# Patient Record
Sex: Female | Born: 1955 | Race: Black or African American | Hispanic: No | Marital: Married | Smoking: Never smoker
Health system: Southern US, Community
[De-identification: ages and names within clinical notes are randomized; demographics above are authoritative.]

## PROBLEM LIST (undated history)

## (undated) DIAGNOSIS — E785 Hyperlipidemia, unspecified: Secondary | ICD-10-CM

## (undated) DIAGNOSIS — M1990 Unspecified osteoarthritis, unspecified site: Secondary | ICD-10-CM

## (undated) DIAGNOSIS — K219 Gastro-esophageal reflux disease without esophagitis: Secondary | ICD-10-CM

## (undated) DIAGNOSIS — C801 Malignant (primary) neoplasm, unspecified: Secondary | ICD-10-CM

## (undated) DIAGNOSIS — I10 Essential (primary) hypertension: Secondary | ICD-10-CM

## (undated) DIAGNOSIS — Z973 Presence of spectacles and contact lenses: Secondary | ICD-10-CM

## (undated) HISTORY — DX: Malignant (primary) neoplasm, unspecified: C80.1

## (undated) HISTORY — DX: Hyperlipidemia, unspecified: E78.5

## (undated) HISTORY — DX: Essential (primary) hypertension: I10

## (undated) HISTORY — DX: Presence of spectacles and contact lenses: Z97.3

## (undated) HISTORY — DX: Gastro-esophageal reflux disease without esophagitis: K21.9

## (undated) HISTORY — PX: COLONOSCOPY: SHX174

---

## 1985-08-29 HISTORY — PX: HAND / FINGER LESION EXCISION: SUR531

## 2000-01-19 ENCOUNTER — Other Ambulatory Visit: Admission: RE | Admit: 2000-01-19 | Discharge: 2000-01-19 | Payer: Self-pay | Admitting: Obstetrics and Gynecology

## 2001-04-09 ENCOUNTER — Other Ambulatory Visit: Admission: RE | Admit: 2001-04-09 | Discharge: 2001-04-09 | Payer: Self-pay | Admitting: Obstetrics and Gynecology

## 2005-09-01 ENCOUNTER — Emergency Department (HOSPITAL_COMMUNITY): Admission: EM | Admit: 2005-09-01 | Discharge: 2005-09-02 | Payer: Self-pay | Admitting: Emergency Medicine

## 2010-03-14 HISTORY — PX: BREAST LUMPECTOMY: SHX2

## 2010-07-07 ENCOUNTER — Ambulatory Visit: Payer: Self-pay | Admitting: Oncology

## 2010-07-09 ENCOUNTER — Encounter: Admission: RE | Admit: 2010-07-09 | Discharge: 2010-07-09 | Payer: Self-pay | Admitting: Diagnostic Radiology

## 2010-07-12 ENCOUNTER — Encounter: Admission: RE | Admit: 2010-07-12 | Discharge: 2010-07-12 | Payer: Self-pay | Admitting: General Surgery

## 2010-07-15 ENCOUNTER — Ambulatory Visit (HOSPITAL_BASED_OUTPATIENT_CLINIC_OR_DEPARTMENT_OTHER): Admission: RE | Admit: 2010-07-15 | Discharge: 2010-07-15 | Payer: Self-pay | Admitting: General Surgery

## 2010-07-19 ENCOUNTER — Ambulatory Visit: Admission: RE | Admit: 2010-07-19 | Discharge: 2010-07-21 | Payer: Self-pay | Admitting: Radiation Oncology

## 2010-11-09 LAB — LACTATE DEHYDROGENASE: LDH: 216 U/L (ref 94–250)

## 2010-11-09 LAB — CBC
HCT: 37.1 % (ref 36.0–46.0)
Hemoglobin: 12.6 g/dL (ref 12.0–15.0)
MCHC: 34 g/dL (ref 30.0–36.0)
MCV: 83.4 fL (ref 78.0–100.0)
RDW: 14.3 % (ref 11.5–15.5)

## 2010-11-09 LAB — DIFFERENTIAL
Basophils Relative: 1 % (ref 0–1)
Eosinophils Absolute: 0.1 10*3/uL (ref 0.0–0.7)
Lymphs Abs: 1.6 10*3/uL (ref 0.7–4.0)
Monocytes Relative: 6 % (ref 3–12)
Neutro Abs: 3.2 10*3/uL (ref 1.7–7.7)
Neutrophils Relative %: 61 % (ref 43–77)

## 2010-11-09 LAB — COMPREHENSIVE METABOLIC PANEL
Alkaline Phosphatase: 56 U/L (ref 39–117)
BUN: 9 mg/dL (ref 6–23)
CO2: 29 mEq/L (ref 19–32)
Calcium: 10 mg/dL (ref 8.4–10.5)
GFR calc non Af Amer: >60 mL/min (ref >60)
Glucose, Bld: 107 mg/dL — ABNORMAL HIGH (ref 70–99)
Total Protein: 7.5 g/dL (ref 6.0–8.3)

## 2011-01-28 ENCOUNTER — Encounter (INDEPENDENT_AMBULATORY_CARE_PROVIDER_SITE_OTHER): Payer: Self-pay | Admitting: General Surgery

## 2011-04-25 ENCOUNTER — Ambulatory Visit (INDEPENDENT_AMBULATORY_CARE_PROVIDER_SITE_OTHER): Payer: Self-pay | Admitting: General Surgery

## 2011-10-14 ENCOUNTER — Telehealth: Payer: Self-pay | Admitting: *Deleted

## 2011-10-14 ENCOUNTER — Encounter (INDEPENDENT_AMBULATORY_CARE_PROVIDER_SITE_OTHER): Payer: Self-pay | Admitting: General Surgery

## 2011-10-14 ENCOUNTER — Ambulatory Visit (INDEPENDENT_AMBULATORY_CARE_PROVIDER_SITE_OTHER): Payer: 59 | Admitting: General Surgery

## 2011-10-14 VITALS — BP 140/96 | HR 118 | Temp 98.0°F | Resp 16 | Ht 65.0 in | Wt 202.8 lb

## 2011-10-14 DIAGNOSIS — D0590 Unspecified type of carcinoma in situ of unspecified breast: Secondary | ICD-10-CM

## 2011-10-14 DIAGNOSIS — Z800 Family history of malignant neoplasm of digestive organs: Secondary | ICD-10-CM

## 2011-10-14 DIAGNOSIS — D0510 Intraductal carcinoma in situ of unspecified breast: Secondary | ICD-10-CM | POA: Insufficient documentation

## 2011-10-14 NOTE — Telephone Encounter (Signed)
Called patient about scheduling an appt and she stated that she has a brother just diagnosed with cancer and she needs to take care of him right now.  So she has decided to try natural medicine right now.  She said that if she changed her mind that she would call Dr. Donell Beers and let her know.

## 2011-10-14 NOTE — Assessment & Plan Note (Signed)
Brother and father with colon cancer. Getting colonoscopies every 3 years.

## 2011-10-14 NOTE — Assessment & Plan Note (Signed)
Pt declined referral last year to oncology to discuss risk reduction with hormone treatment. Will refer to see Dr. Welton Flakes.   No evidence of disease at this time on mammo or physical exam.

## 2011-10-14 NOTE — Patient Instructions (Signed)
See Dr. Welton Flakes at the Cancer center to talk about hormone treatment and cancer family history.    Get colonoscopy this year.

## 2011-10-14 NOTE — Progress Notes (Signed)
Chief Complaint  Patient presents with  . Breast Cancer Long Term Follow Up    right breast lumpectomy    HISTORY: Pt presents now 1 year after R lumpectomy for DCIS by Dr. Freida Busman. The patient never went to the cancer center to discuss tamoxifen or other hormone risk reducing treatment.  She has no new complaints.  She had her mammogram which was negative for concerning lesions.  She has not felt any new breast lesions.  She denies nipple discharge or other new health problems.  She gets anxious when she comes to see the doctor.  She otherwise feels well.   Her brother has recurrent colon cancer that the pt describes as going to the lung.  She is also anxious about that.  Dr. Loreta Ave does her screening colonoscopies and she is due for one this year.    Past Medical History  Diagnosis Date  . Hypertension   . Hyperlipemia   . Wears glasses   . Cancer     right breast  . GERD (gastroesophageal reflux disease)     Past Surgical History  Procedure Date  . Hand / finger lesion excision 1987  . Breast lumpectomy 03/14/10    right breast    Current Outpatient Prescriptions  Medication Sig Dispense Refill  . co-enzyme Q-10 50 MG capsule Take 50 mg by mouth 2 (two) times daily.        Marland Kitchen GARLIC PO Take by mouth.        Marland Kitchen HAWTHORN PO Take by mouth.        Marland Kitchen lisinopril (PRINIVIL,ZESTRIL) 5 MG tablet Take 5 mg by mouth daily.        . Multiple Vitamin (MULTIVITAMIN PO) Take by mouth.           Allergies  Allergen Reactions  . Vicodin (Hydrocodone-Acetaminophen) Swelling     Family History  Problem Relation Age of Onset  . Rectal cancer Brother   . Cancer Brother     colon  . Cancer Father     colon  . Stroke Father   . Cancer Sister     breast  . Cancer Sister     colon     History   Social History  . Marital Status: Married    Spouse Name: N/A    Number of Children: N/A  . Years of Education: N/A   Social History Main Topics  . Smoking status: Never Smoker   .  Smokeless tobacco: None  . Alcohol Use: No  . Drug Use: No  . Sexually Active:     REVIEW OF SYSTEMS - PERTINENT POSITIVES ONLY: 12 point review of systems negative other than HPI and PMH except for constipation  EXAM: Filed Vitals:   10/14/11 1028  BP: 140/96  Pulse: 118  Temp: 98 F (36.7 C)  Resp: 16    Gen:  No acute distress.  Well nourished and well groomed.   Neurological: Alert and oriented to person, place, and time. Coordination normal.  Head: Normocephalic and atraumatic.  Eyes: Conjunctivae are normal. Pupils are equal, round, and reactive to light. No scleral icterus.  Neck: Normal range of motion. Neck supple. No tracheal deviation or thyromegaly present.  Cardiovascular: Normal rate, regular rhythm, normal heart sounds and intact distal pulses.  Exam reveals no gallop and no friction rub.  No murmur heard. Respiratory: Effort normal.  No respiratory distress. No chest wall tenderness. Breath sounds normal.  No wheezes, rales or rhonchi.  Breast:  Breast exam is negative for masses, skin dimpling, or nipple retraction.  She has developed some skin changes.  R breast is sore at scar, no masses found. GI: Soft. Bowel sounds are normal. The abdomen is soft and nontender.  There is no rebound and no guarding.  Musculoskeletal: Normal range of motion. Extremities are nontender.  Lymphadenopathy: No cervical, preauricular, postauricular or axillary adenopathy is present Skin: Skin is warm and dry. No rash noted. No diaphoresis. No erythema. No pallor. No clubbing, cyanosis, or edema.   Psychiatric: Normal mood and affect. Behavior is normal. Judgment and thought content normal.    RADIOLOGY RESULTS: Mammogram at [facility].  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 Will refer to see Dr. Welton Flakes.   No evidence of disease at this time on mammo  or physical exam.      Family history of colon cancer Brother and father with colon cancer. Getting colonoscopies every 3 years.      Maudry Diego MD Surgical Oncology, General and Endocrine Surgery [facility]    Visit Diagnoses: 1. 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 Family history of colon cancer     Primary Care Physician: Alva Garnet., MD, MD

## 2011-10-17 ENCOUNTER — Encounter (INDEPENDENT_AMBULATORY_CARE_PROVIDER_SITE_OTHER): Payer: Self-pay

## 2011-10-17 ENCOUNTER — Telehealth (INDEPENDENT_AMBULATORY_CARE_PROVIDER_SITE_OTHER): Payer: Self-pay

## 2011-10-17 NOTE — Telephone Encounter (Signed)
Madison Lopez called to let us know that the pt is not going to pursue care at this time.  She has a brother with cancer and she will be concentrating on taking care of him.  She will let us know if her situation changes.

## 2012-02-07 ENCOUNTER — Encounter (INDEPENDENT_AMBULATORY_CARE_PROVIDER_SITE_OTHER): Payer: Self-pay | Admitting: General Surgery

## 2012-04-16 ENCOUNTER — Encounter (INDEPENDENT_AMBULATORY_CARE_PROVIDER_SITE_OTHER): Payer: Self-pay | Admitting: General Surgery

## 2012-04-16 ENCOUNTER — Ambulatory Visit (INDEPENDENT_AMBULATORY_CARE_PROVIDER_SITE_OTHER): Payer: 59 | Admitting: General Surgery

## 2012-04-16 VITALS — BP 160/74 | HR 112 | Temp 98.0°F | Resp 16 | Ht 65.0 in | Wt 208.4 lb

## 2012-04-16 DIAGNOSIS — D0590 Unspecified type of carcinoma in situ of unspecified breast: Secondary | ICD-10-CM

## 2012-04-16 DIAGNOSIS — D0510 Intraductal carcinoma in situ of unspecified breast: Secondary | ICD-10-CM

## 2012-04-16 NOTE — Progress Notes (Signed)
Chief Complaint  Patient presents with  . Breast Cancer Long Term Follow Up    HISTORY: Pt presents now 1 year after R lumpectomy for DCIS by Dr. Freida Busman. The patient never went to the cancer center to discuss tamoxifen or other hormone risk reducing treatment.  The patient did not followup with Dr. Lennette Bihari. Her brother was diagnosed with colon cancer, and she was too anxious taking care of them to discuss tamoxifen treatment. She has not felt any masses. She has not had any change in her breast contour. She denies any nipple discharge or skin dimpling. She is due for her colonoscopy. She had one over 3 years ago.  It was recommended to be repeated in 3 years because of polyps.  Past Medical History  Diagnosis Date  . Hypertension   . Hyperlipemia   . Wears glasses   . Cancer     right breast  . GERD (gastroesophageal reflux disease)     Past Surgical History  Procedure Date  . Hand / finger lesion excision 1987  . Breast lumpectomy 03/14/10    right breast    Current Outpatient Prescriptions  Medication Sig Dispense Refill  . co-enzyme Q-10 50 MG capsule Take 50 mg by mouth 2 (two) times daily.        Marland Kitchen GARLIC PO Take by mouth.        Marland Kitchen HAWTHORN PO Take by mouth.        Marland Kitchen lisinopril (PRINIVIL,ZESTRIL) 5 MG tablet Take 5 mg by mouth daily.        . Multiple Vitamin (MULTIVITAMIN PO) Take by mouth.           Allergies  Allergen Reactions  . Vicodin (Hydrocodone-Acetaminophen) Swelling     Family History  Problem Relation Age of Onset  . Rectal cancer Brother   . Cancer Brother     colon  . Cancer Father     colon  . Stroke Father   . Cancer Sister     breast  . Cancer Sister     colon     History   Social History  . Marital Status: Married    Spouse Name: N/A    Number of Children: N/A  . Years of Education: N/A   Social History Main Topics  . Smoking status: Never Smoker   . Smokeless tobacco: None  . Alcohol Use: No  . Drug Use: No  . Sexually Active:       REVIEW OF SYSTEMS - PERTINENT POSITIVES ONLY: 12 point review of systems negative other than HPI and PMH except for constipation  EXAM: Filed Vitals:   04/16/12 1654  BP: 160/74  Pulse: 112  Temp: 98 F (36.7 C)  Resp: 16    Gen:  No acute distress.  Well nourished and well groomed.   Neurological: Alert and oriented to person, place, and time. Coordination normal.  Head: Normocephalic and atraumatic.  Eyes: Conjunctivae are normal. Pupils are equal, round, and reactive to light. No scleral icterus.  Neck: Normal range of motion. Neck supple. No tracheal deviation or thyromegaly present.  Cardiovascular: Normal rate, regular rhythm, normal heart sounds and intact distal pulses.  Exam reveals no gallop and no friction rub.  No murmur heard. Respiratory: Effort normal.  No respiratory distress. No chest wall tenderness. Breath sounds normal.  No wheezes, rales or rhonchi.  Breast:  Breast exam is negative for masses, skin dimpling, or nipple retraction.    R breast is no longer  sore at scar, no masses found. No axillary lymphadenopathy. GI: Soft. Bowel sounds are normal. The abdomen is soft and nontender.  There is no rebound and no guarding.  Musculoskeletal: Normal range of motion. Extremities are nontender.  Lymphadenopathy: No cervical, preauricular, postauricular or axillary adenopathy is present Skin: Skin is warm and dry. No rash noted. No diaphoresis. No erythema. No pallor. No clubbing, cyanosis, or edema.   Psychiatric: Normal mood and affect. Behavior is normal. Judgment and thought content normal.    RADIOLOGY RESULTS: Mammogram at [facility].  Birads 2 No new mammogram since last visit.     ASSESSMENT AND PLAN: Patient Active Problem List  Diagnosis  . 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 Family history of colon cancer  Follow up in 6 months. Repeat mammogram in January. Schedule colonoscopy.   Maudry Diego MD Surgical Oncology, General and  Endocrine Surgery [facility]    Visit Diagnoses: No diagnosis found.  Primary Care Physician: Alva Garnet., MD

## 2012-04-16 NOTE — Patient Instructions (Signed)
Follow up in 6 months. Get mammogram in January.

## 2012-12-06 ENCOUNTER — Other Ambulatory Visit (INDEPENDENT_AMBULATORY_CARE_PROVIDER_SITE_OTHER): Payer: Self-pay | Admitting: General Surgery

## 2012-12-06 ENCOUNTER — Telehealth (INDEPENDENT_AMBULATORY_CARE_PROVIDER_SITE_OTHER): Payer: Self-pay

## 2012-12-06 DIAGNOSIS — C50911 Malignant neoplasm of unspecified site of right female breast: Secondary | ICD-10-CM

## 2012-12-06 NOTE — Telephone Encounter (Signed)
Pt made aware her diagnostic mgm is scheduled for 12/12/12, 8:30am, at [facility].

## 2012-12-13 ENCOUNTER — Telehealth (INDEPENDENT_AMBULATORY_CARE_PROVIDER_SITE_OTHER): Payer: Self-pay | Admitting: General Surgery

## 2012-12-13 NOTE — Telephone Encounter (Signed)
Patient called stating she is scheduled for a diagnostic mammogram on Monday with [facility]. They have called her stating they don't have an order from [place] and will cancel the appt if they do not get one. Dr Derrell Lolling signed Mammogram order and it was faxed to [facility] at [phone]. Confirmation received.

## 2012-12-27 ENCOUNTER — Encounter (INDEPENDENT_AMBULATORY_CARE_PROVIDER_SITE_OTHER): Payer: Self-pay

## 2013-05-27 ENCOUNTER — Encounter (INDEPENDENT_AMBULATORY_CARE_PROVIDER_SITE_OTHER): Payer: Self-pay

## 2013-06-16 ENCOUNTER — Ambulatory Visit: Payer: 59 | Admitting: Family Medicine

## 2013-06-16 ENCOUNTER — Ambulatory Visit: Payer: 59

## 2013-06-16 VITALS — BP 124/88 | HR 120 | Temp 98.8°F | Resp 18 | Ht 64.0 in | Wt 204.0 lb

## 2013-06-16 DIAGNOSIS — J9801 Acute bronchospasm: Secondary | ICD-10-CM

## 2013-06-16 DIAGNOSIS — R05 Cough: Secondary | ICD-10-CM

## 2013-06-16 DIAGNOSIS — J209 Acute bronchitis, unspecified: Secondary | ICD-10-CM

## 2013-06-16 DIAGNOSIS — R300 Dysuria: Secondary | ICD-10-CM

## 2013-06-16 LAB — POCT URINALYSIS DIPSTICK
Bilirubin, UA: NEGATIVE
Blood, UA: NEGATIVE
Glucose, UA: NEGATIVE
Ketones, UA: NEGATIVE
Leukocytes, UA: NEGATIVE
Nitrite, UA: NEGATIVE
Protein, UA: NEGATIVE
Spec Grav, UA: 1.01
Urobilinogen, UA: 0.2
pH, UA: 7

## 2013-06-16 LAB — POCT UA - MICROSCOPIC ONLY
Bacteria, U Microscopic: NEGATIVE
Casts, Ur, LPF, POC: NEGATIVE
Crystals, Ur, HPF, POC: NEGATIVE
Epithelial cells, urine per micros: NEGATIVE
Mucus, UA: NEGATIVE
WBC, Ur, HPF, POC: NEGATIVE
Yeast, UA: NEGATIVE

## 2013-06-16 MED ORDER — DOXYCYCLINE HYCLATE 100 MG PO CAPS: 100.0000 mg | ORAL_CAPSULE | Freq: Two times a day (BID) | ORAL | Status: DC

## 2013-06-16 MED ORDER — LEVALBUTEROL HCL 0.63 MG/3ML IN NEBU
0.6300 mg | INHALATION_SOLUTION | Freq: Once | RESPIRATORY_TRACT | Status: AC
  Administered 2013-06-16: 0.63 mg via RESPIRATORY_TRACT

## 2013-06-16 NOTE — Patient Instructions (Signed)
1.  Recommend Zyrtec 10mg  daily for chest congestion and allergies. 2.  Also recommend Mucinex twice daily.

## 2013-06-16 NOTE — Progress Notes (Signed)
Subjective:  This chart was scribed for Ethelda Chick, MD by Caryn Bee, Medical Scribe. This patient was seen in Room3 and the patient's care was started at 11:24 AM.   Patient ID: Madison Lopez, female    DOB: 1956-08-21, 57 y.o.   MRN: [id number]  HPI HPI Comments: Madison Lopez is a 57 y.o. female who presents to [facility] complaining of gradual onset congestion in chest that began after exercising and going outside about one week ago. Pt was recently told that she's obese and decided to begin exercising. She states that she has started taking liquid Mucinex for the congestion a few days ago with mild relief. Pt has had associated decreased appetite. She also complains of intermittent dysuria onset a few days ago. She had her husband buy urinary tablets. She has had only one UTI that she can remember. Pt states that she usually urinates often, about 3-4 times per night at baseline. Pt has had some sweating at night and post nasal drip. She also has associated productive cough. Pt denies fever, chills, headache, ear pain, sore throat, SOB, nausea, vomiting, diarrhea, hematuria, back pain, increased urinary frequency, vaginal discharge. She has no h/o asthma. Pt does not smoke. Pt is retired.  Pt's PCP is Dr. Andi Devon.   Review of Systems  Constitutional: Negative for fever and chills.  HENT: Positive for postnasal drip and rhinorrhea. Negative for ear pain, sinus pressure and sore throat.   Respiratory: Positive for cough. Negative for shortness of breath, wheezing and stridor.   Gastrointestinal: Negative for nausea, vomiting, abdominal pain, diarrhea and constipation.  Genitourinary: Positive for dysuria. Negative for urgency, frequency and vaginal discharge.  Musculoskeletal: Negative for back pain.  Neurological: Negative for headaches.   Past Medical History  Diagnosis Date  . Hypertension   . Hyperlipemia   . Wears glasses   . Cancer     right breast  . GERD  (gastroesophageal reflux disease)    History   Social History  . Marital Status: Married    Spouse Name: N/A    Number of Children: N/A  . Years of Education: N/A   Occupational History  . Not on file.   Social History Main Topics  . Smoking status: Never Smoker   . Smokeless tobacco: Not on file  . Alcohol Use: No  . Drug Use: No  . Sexual Activity:    Other Topics Concern  . Not on file   Social History Narrative  . No narrative on file   Past Surgical History  Procedure Laterality Date  . Hand / finger lesion excision  1987  . Breast lumpectomy  03/14/10    right breast   Family History  Problem Relation Age of Onset  . Rectal cancer Brother   . Cancer Brother     colon  . Cancer Father     colon  . Stroke Father   . Cancer Sister     breast  . Cancer Sister     colon        Objective:   Physical Exam  Nursing note and vitals reviewed. Constitutional: She is oriented to person, place, and time. She appears well-developed and well-nourished.  HENT:  Head: Normocephalic and atraumatic.  Right Ear: External ear normal.  Left Ear: External ear normal.  Nose: Nose normal.  Mouth/Throat: Oropharynx is clear and moist.  Eyes: Conjunctivae and EOM are normal. Pupils are equal, round, and reactive to light.  Neck: Normal range of  motion. Neck supple.  Cardiovascular: Regular rhythm and normal heart sounds.  Tachycardia present.  Exam reveals no gallop and no friction rub.   No murmur heard. Tachycardic at 110.  Pulmonary/Chest: Effort normal. She has wheezes. She has rales.  Wheezing at the right lung base with rales. But good air movement. No tachypnea.   Abdominal: Soft. Bowel sounds are normal. She exhibits no distension and no mass. There is no tenderness. There is no rebound and no guarding.  Lymphadenopathy:    She has no cervical adenopathy.  Neurological: She is alert and oriented to person, place, and time.  Skin: Skin is warm and dry.   Psychiatric: She has a normal mood and affect. Her behavior is normal.   Results for orders placed in visit on 06/16/13  POCT UA - MICROSCOPIC ONLY      Result Value Range   WBC, Ur, HPF, POC neg     RBC, urine, microscopic 0-1     Bacteria, U Microscopic neg     Mucus, UA neg     Epithelial cells, urine per micros neg     Crystals, Ur, HPF, POC neg     Casts, Ur, LPF, POC neg     Yeast, UA neg    POCT URINALYSIS DIPSTICK      Result Value Range   Color, UA light yellow     Clarity, UA clear     Glucose, UA neg     Bilirubin, UA neg     Ketones, UA neg     Spec Grav, UA 1.010     Blood, UA neg     pH, UA 7.0     Protein, UA neg     Urobilinogen, UA 0.2     Nitrite, UA neg     Leukocytes, UA Negative     [facility] X-ray reading by Dr. Katrinka Blazing CXR: NAD.     Assessment & Plan:   Bronchospasm - Plan: DG Chest 2 View, levalbuterol (XOPENEX) nebulizer solution 0.63 mg  Burning with urination - Plan: POCT UA - Microscopic Only, POCT urinalysis dipstick, Urine culture  Cough - Plan: DG Chest 2 View, levalbuterol (XOPENEX) nebulizer solution 0.63 mg  Dysuria - Plan: Urine culture  Acute bronchitis  1.  Acute bronchitis:  New.  Rx for Doxycycline provided.  Recommend Mucinex DM bid.   2. Bronchospasm:  New.  S/p Xopenex nebulizer in office.   3.  Dysuria:  New.  Send urine culture; u/a benign.  Doxycycline should cover for UTI if present.   Meds ordered this encounter  Medications  . levalbuterol (XOPENEX) nebulizer solution 0.63 mg    Sig:   . doxycycline (VIBRAMYCIN) 100 MG capsule    Sig: Take 1 capsule (100 mg total) by mouth 2 (two) times daily.    Dispense:  14 capsule    Refill:  0

## 2013-06-17 LAB — URINE CULTURE
Colony Count: NO GROWTH
Organism ID, Bacteria: NO GROWTH

## 2014-03-05 ENCOUNTER — Ambulatory Visit (INDEPENDENT_AMBULATORY_CARE_PROVIDER_SITE_OTHER): Payer: 59 | Admitting: Family Medicine

## 2014-03-05 VITALS — BP 128/68 | HR 97 | Temp 98.0°F | Resp 18 | Ht 64.0 in | Wt 204.0 lb

## 2014-03-05 DIAGNOSIS — L723 Sebaceous cyst: Secondary | ICD-10-CM

## 2014-03-05 NOTE — Progress Notes (Signed)
[facility] [address] [place] [address] [phone]  Date:  03/05/2014   Name:  Madison Lopez   DOB:  1956/08/25   MRN:  [id number]  PCP:  Salena Saner., MD    Chief Complaint: Recurrent Skin Infections   History of Present Illness:  Madison Lopez is a 58 y.o. very pleasant female patient who presents with the following:  She is here today for a problem with infection on her scalp.  As a child, she had a wound on her head that needed stitches and seemed to cause a chronic "knot." She had noted this knot over her whole life, but earlier this month it became larger and painful.  She saw Dr. Karlton Lemon and was told she had some sort of abscess on her head.  The area was already draining, but cultrue was sent and she was told that she does not have MRSA.  The area seemed to be doing better, but then over the last day or so pus and soreness came back.  She is really worried about this problem. Otherwise she is in generally good health. History of stage 0 breast cancer a few years ago, cured  Patient Active Problem List   Diagnosis Date Noted  . 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 Family history of colon cancer 10/14/2011    Past Medical History  Diagnosis Date  . Hypertension   . Hyperlipemia   . Wears glasses   . Cancer     right breast  . GERD (gastroesophageal reflux disease)     Past Surgical History  Procedure Laterality Date  . Hand / finger lesion excision  1987  . Breast lumpectomy  03/14/10    right breast    History  Substance Use Topics  . Smoking status: Never Smoker   . Smokeless tobacco: Not on file  . Alcohol Use: No    Family History  Problem Relation Age of Onset  . Rectal cancer Brother   . Cancer Brother     colon  . Cancer Father     colon  . Stroke Father   . Cancer Sister     breast  . Cancer Sister     colon    Allergies  Allergen Reactions  . Vicodin [Hydrocodone-Acetaminophen]  Swelling    Medication list has been reviewed and updated.  Current Outpatient Prescriptions on File Prior to Visit  Medication Sig Dispense Refill  . co-enzyme Q-10 50 MG capsule Take 50 mg by mouth 2 (two) times daily.        Marland Kitchen GARLIC PO Take by mouth.        Marland Kitchen HAWTHORN PO Take by mouth.        Marland Kitchen lisinopril (PRINIVIL,ZESTRIL) 5 MG tablet Take 2.5 mg by mouth daily.       . Multiple Vitamin (MULTIVITAMIN PO) Take by mouth.        . doxycycline (VIBRAMYCIN) 100 MG capsule Take 1 capsule (100 mg total) by mouth 2 (two) times daily.  14 capsule  0   No current facility-administered medications on file prior to visit.    Review of Systems:  As per HPI- otherwise negative.   Physical Examination: Filed Vitals:   03/05/14 1621  BP: 128/68  Pulse: 97  Temp: 98 F (36.7 C)  Resp: 18   Filed Vitals:   03/05/14 1621  Height: 5\' 4"  (1.626 m)  Weight: 204 lb (92.534 kg)  Body mass index is 35 kg/(m^2). Ideal Body Weight: Weight in (lb) to have BMI = 25: 145.3  GEN: WDWN, NAD, Non-toxic, A & O x 3, overweight, looks well HEENT: Atraumatic, Normocephalic. Neck supple. No masses, No LAD. Ears and Nose: No external deformity. CV: RRR, No M/G/R. No JVD. No thrill. No extra heart sounds. PULM: CTA B, no wheezes, crackles, rhonchi. No retractions. No resp. distress. No accessory muscle use. EXTR: No c/c/e NEURO Normal gait.  PSYCH: Normally interactive. Conversant. Not depressed or anxious appearing.  Calm demeanor.  There is a draining lesion on the crown of her head.  The material coming out is sebaceous and there is no odor. There is a round central hole through which this material is draining.  Suspect a sebaceous cyst.   See I and D note per Georgiann Mccoy, PA-C.     Assessment and Plan: Sebaceous cyst  Abscess is likely actually a sebaceous cyst.  I and D, packing today.  Recheck in 48 hours- Sooner if worse.     Signed Lamar Blinks, MD

## 2014-03-05 NOTE — Progress Notes (Signed)
Patient ID: [name] MRN: [id number], DOB: [birth year], 59 y.o. Date of Encounter: 03/05/2014, 5:31 PM    PROCEDURE NOTE: Verbal consent obtained. Local anesthesia obtained with 2% lidocaine with epinephrine.   1 cm incision made with 11 blade along lesion.  Culture taken. No purulence expressed. Lesion explored revealing no loculations. Packed with 1/4 plain packing.  Dressed. Wound care instructions including precautions with patient. Patient tolerated the procedure well. Recheck in 48 hours     Signed, Georgiann Mccoy, PA-C 03/05/2014 5:31 PM

## 2014-03-07 ENCOUNTER — Ambulatory Visit (INDEPENDENT_AMBULATORY_CARE_PROVIDER_SITE_OTHER): Payer: 59 | Admitting: Physician Assistant

## 2014-03-07 VITALS — BP 130/70 | HR 99 | Temp 98.7°F | Resp 16 | Ht 64.5 in | Wt 205.0 lb

## 2014-03-07 DIAGNOSIS — L723 Sebaceous cyst: Secondary | ICD-10-CM

## 2014-03-07 NOTE — Progress Notes (Signed)
   Subjective:    Patient ID: Madison Lopez, female    DOB: 31-Aug-1955, 58 y.o.   MRN: [id number]  Wound Check     Ms. Madison Lopez is a very pleasant 58 yr old female here for wound care following I&D of a sebaceous cyst 03/05/14.  Pt reports she is doing well.  She is much less tender.  She is taking the medication as directed.  She is concerned about when she will be able to wash and dye her hair    Review of Systems  Constitutional: Negative for fever and chills.  Respiratory: Negative.   Cardiovascular: Negative.   Gastrointestinal: Negative.   Skin: Positive for wound.       Objective:   Physical Exam  Vitals reviewed. Constitutional: She is oriented to person, place, and time. She appears well-developed and well-nourished. No distress.  HENT:  Head: Normocephalic and atraumatic.  Eyes: Conjunctivae are normal. No scleral icterus.  Pulmonary/Chest: Effort normal.  Neurological: She is alert and oriented to person, place, and time.  Skin: Skin is warm and dry.     Healing wound at scalp; no surrounding erythema, induration, warmth  Psychiatric: She has a normal mood and affect. Her behavior is normal.    Wound Care: Dressing and packing removed.  Small amount of thick purulent/bloody material spontaneously drained with removal of packing.  Able to express a tiny amount of further drainage with manipulation.  Not repacked today.  Dressing applied.      Assessment & Plan:  Sebaceous cyst   Ms. Madison Lopez is a very pleasant 58 yr old female here for wound care following I&D of a sebaceous cyst.  Wound is healing well.  I have not repacked today.  Discussed wound care with pt - she had many questions about this.  Anticipate that the wound will continue to drain for several days - the opening is sufficient to facilitate drainage and I don't think we are in danger of it closing to early.  Discussed with pt that complete healing will take a couple of weeks.  She is ok to wash her hair  but suggested she hold off on dying the hair until wound is completely healed  No need for follow up unless pt has concerns  E. Natividad Brood MHS, PA-C [facility] 7/10/20151:24 PM

## 2014-06-17 ENCOUNTER — Other Ambulatory Visit (INDEPENDENT_AMBULATORY_CARE_PROVIDER_SITE_OTHER): Payer: Self-pay | Admitting: General Surgery

## 2014-06-17 DIAGNOSIS — C50911 Malignant neoplasm of unspecified site of right female breast: Secondary | ICD-10-CM

## 2014-06-23 ENCOUNTER — Other Ambulatory Visit (INDEPENDENT_AMBULATORY_CARE_PROVIDER_SITE_OTHER): Payer: Self-pay

## 2014-06-23 DIAGNOSIS — C50911 Malignant neoplasm of unspecified site of right female breast: Secondary | ICD-10-CM

## 2014-11-12 ENCOUNTER — Encounter (HOSPITAL_BASED_OUTPATIENT_CLINIC_OR_DEPARTMENT_OTHER): Payer: Self-pay | Admitting: *Deleted

## 2014-11-12 NOTE — Progress Notes (Signed)
Pt had her breast surgery here-will come in for ekg-bmet

## 2014-11-13 ENCOUNTER — Other Ambulatory Visit: Payer: Self-pay

## 2014-11-13 ENCOUNTER — Other Ambulatory Visit: Payer: Self-pay | Admitting: General Surgery

## 2014-11-13 ENCOUNTER — Encounter (HOSPITAL_BASED_OUTPATIENT_CLINIC_OR_DEPARTMENT_OTHER)
Admission: RE | Admit: 2014-11-13 | Discharge: 2014-11-13 | Disposition: A | Discharge status: Home / Self Care | Payer: 59 | Source: Ambulatory Visit | Attending: General Surgery | Admitting: General Surgery

## 2014-11-13 DIAGNOSIS — D171 Benign lipomatous neoplasm of skin and subcutaneous tissue of trunk: Secondary | ICD-10-CM | POA: Diagnosis not present

## 2014-11-13 DIAGNOSIS — Z01812 Encounter for preprocedural laboratory examination: Secondary | ICD-10-CM | POA: Diagnosis not present

## 2014-11-13 DIAGNOSIS — E780 Pure hypercholesterolemia: Secondary | ICD-10-CM | POA: Diagnosis not present

## 2014-11-13 DIAGNOSIS — K5730 Diverticulosis of large intestine without perforation or abscess without bleeding: Secondary | ICD-10-CM | POA: Diagnosis not present

## 2014-11-13 DIAGNOSIS — Z01810 Encounter for preprocedural cardiovascular examination: Secondary | ICD-10-CM | POA: Insufficient documentation

## 2014-11-13 DIAGNOSIS — Z853 Personal history of malignant neoplasm of breast: Secondary | ICD-10-CM | POA: Diagnosis not present

## 2014-11-13 DIAGNOSIS — N926 Irregular menstruation, unspecified: Secondary | ICD-10-CM | POA: Diagnosis not present

## 2014-11-13 DIAGNOSIS — F419 Anxiety disorder, unspecified: Secondary | ICD-10-CM | POA: Diagnosis not present

## 2014-11-13 DIAGNOSIS — Z886 Allergy status to analgesic agent status: Secondary | ICD-10-CM | POA: Diagnosis not present

## 2014-11-13 DIAGNOSIS — R222 Localized swelling, mass and lump, trunk: Secondary | ICD-10-CM | POA: Diagnosis present

## 2014-11-13 DIAGNOSIS — I10 Essential (primary) hypertension: Secondary | ICD-10-CM | POA: Diagnosis not present

## 2014-11-13 DIAGNOSIS — Z86010 Personal history of colonic polyps: Secondary | ICD-10-CM | POA: Diagnosis not present

## 2014-11-13 LAB — BASIC METABOLIC PANEL
Anion gap: 10 (ref 5–15)
BUN: 11 mg/dL (ref 6–23)
CHLORIDE: 100 mmol/L (ref 96–112)
CO2: 29 mmol/L (ref 19–32)
Calcium: 9.8 mg/dL (ref 8.4–10.5)
Creatinine, Ser: 0.79 mg/dL (ref 0.50–1.10)
GFR calc Af Amer: >90 mL/min (ref >90)
GFR calc non Af Amer: >90 mL/min (ref >90)
GLUCOSE: 88 mg/dL (ref 70–99)
Potassium: 4.2 mmol/L (ref 3.5–5.1)
Sodium: 139 mmol/L (ref 135–145)

## 2014-11-18 ENCOUNTER — Ambulatory Visit (HOSPITAL_BASED_OUTPATIENT_CLINIC_OR_DEPARTMENT_OTHER)
Admission: RE | Admit: 2014-11-18 | Discharge: 2014-11-18 | Disposition: A | Discharge status: Home / Self Care | Payer: 59 | Source: Ambulatory Visit | Attending: General Surgery | Admitting: General Surgery

## 2014-11-18 ENCOUNTER — Encounter (HOSPITAL_BASED_OUTPATIENT_CLINIC_OR_DEPARTMENT_OTHER): Payer: Self-pay | Admitting: *Deleted

## 2014-11-18 ENCOUNTER — Encounter (HOSPITAL_BASED_OUTPATIENT_CLINIC_OR_DEPARTMENT_OTHER)
Admission: RE | Disposition: A | Discharge status: Home / Self Care | Payer: Self-pay | Source: Ambulatory Visit | Attending: General Surgery

## 2014-11-18 ENCOUNTER — Ambulatory Visit (HOSPITAL_BASED_OUTPATIENT_CLINIC_OR_DEPARTMENT_OTHER): Payer: 59 | Admitting: Anesthesiology

## 2014-11-18 DIAGNOSIS — D171 Benign lipomatous neoplasm of skin and subcutaneous tissue of trunk: Secondary | ICD-10-CM | POA: Insufficient documentation

## 2014-11-18 DIAGNOSIS — Z853 Personal history of malignant neoplasm of breast: Secondary | ICD-10-CM | POA: Insufficient documentation

## 2014-11-18 DIAGNOSIS — Z86010 Personal history of colonic polyps: Secondary | ICD-10-CM | POA: Insufficient documentation

## 2014-11-18 DIAGNOSIS — K5730 Diverticulosis of large intestine without perforation or abscess without bleeding: Secondary | ICD-10-CM | POA: Insufficient documentation

## 2014-11-18 DIAGNOSIS — Z886 Allergy status to analgesic agent status: Secondary | ICD-10-CM | POA: Insufficient documentation

## 2014-11-18 DIAGNOSIS — I10 Essential (primary) hypertension: Secondary | ICD-10-CM | POA: Insufficient documentation

## 2014-11-18 DIAGNOSIS — N926 Irregular menstruation, unspecified: Secondary | ICD-10-CM | POA: Insufficient documentation

## 2014-11-18 DIAGNOSIS — E780 Pure hypercholesterolemia: Secondary | ICD-10-CM | POA: Insufficient documentation

## 2014-11-18 DIAGNOSIS — F419 Anxiety disorder, unspecified: Secondary | ICD-10-CM | POA: Insufficient documentation

## 2014-11-18 HISTORY — PX: MASS EXCISION: SHX2000

## 2014-11-18 HISTORY — DX: Unspecified osteoarthritis, unspecified site: M19.90

## 2014-11-18 LAB — POCT HEMOGLOBIN-HEMACUE: Hemoglobin: 13.5 g/dL (ref 12.0–15.0)

## 2014-11-18 SURGERY — EXCISION MASS
Anesthesia: General | Site: Back | Laterality: Left

## 2014-11-18 MED ORDER — FENTANYL CITRATE 0.05 MG/ML IJ SOLN
INTRAMUSCULAR | Status: AC
  Filled 2014-11-18: qty 4

## 2014-11-18 MED ORDER — SODIUM CHLORIDE 0.9 % IV SOLN: 250.0000 mL | INTRAVENOUS | Status: DC | PRN

## 2014-11-18 MED ORDER — LIDOCAINE-EPINEPHRINE (PF) 1 %-1:200000 IJ SOLN
INTRAMUSCULAR | Status: DC | PRN
  Administered 2014-11-18: 10 mL

## 2014-11-18 MED ORDER — OXYCODONE HCL 5 MG PO TABS: 5.0000 mg | ORAL_TABLET | Freq: Once | ORAL | Status: DC | PRN

## 2014-11-18 MED ORDER — PROPOFOL 10 MG/ML IV BOLUS
INTRAVENOUS | Status: AC
  Filled 2014-11-18: qty 20

## 2014-11-18 MED ORDER — HYDROMORPHONE HCL 1 MG/ML IJ SOLN: 0.2500 mg | INTRAMUSCULAR | Status: DC | PRN

## 2014-11-18 MED ORDER — ONDANSETRON HCL 4 MG/2ML IJ SOLN
INTRAMUSCULAR | Status: DC | PRN
  Administered 2014-11-18: 4 mg via INTRAVENOUS

## 2014-11-18 MED ORDER — FENTANYL CITRATE 0.05 MG/ML IJ SOLN
50.0000 ug | INTRAMUSCULAR | Status: DC | PRN
Start: 2014-11-18 — End: 2014-11-18

## 2014-11-18 MED ORDER — BUPIVACAINE HCL (PF) 0.25 % IJ SOLN
INTRAMUSCULAR | Status: DC | PRN
  Administered 2014-11-18: 10 mL

## 2014-11-18 MED ORDER — OXYCODONE HCL 5 MG/5ML PO SOLN: 5.0000 mg | Freq: Once | ORAL | Status: DC | PRN

## 2014-11-18 MED ORDER — OXYCODONE HCL 5 MG PO TABS: 5.0000 mg | ORAL_TABLET | ORAL | Status: DC | PRN

## 2014-11-18 MED ORDER — MIDAZOLAM HCL 2 MG/2ML IJ SOLN: 1.0000 mg | INTRAMUSCULAR | Status: DC | PRN

## 2014-11-18 MED ORDER — SUCCINYLCHOLINE CHLORIDE 20 MG/ML IJ SOLN
INTRAMUSCULAR | Status: DC | PRN
  Administered 2014-11-18: 100 mg via INTRAVENOUS

## 2014-11-18 MED ORDER — MIDAZOLAM HCL 5 MG/5ML IJ SOLN
INTRAMUSCULAR | Status: DC | PRN
  Administered 2014-11-18: 2 mg via INTRAVENOUS

## 2014-11-18 MED ORDER — PROPOFOL 10 MG/ML IV BOLUS
INTRAVENOUS | Status: DC | PRN
  Administered 2014-11-18: 200 mg via INTRAVENOUS

## 2014-11-18 MED ORDER — FENTANYL CITRATE 0.05 MG/ML IJ SOLN
INTRAMUSCULAR | Status: DC | PRN
  Administered 2014-11-18: 100 ug via INTRAVENOUS

## 2014-11-18 MED ORDER — SODIUM CHLORIDE 0.9 % IJ SOLN: 3.0000 mL | Freq: Two times a day (BID) | INTRAMUSCULAR | Status: DC

## 2014-11-18 MED ORDER — CEFAZOLIN SODIUM-DEXTROSE 2-3 GM-% IV SOLR
2.0000 g | INTRAVENOUS | Status: AC
  Administered 2014-11-18: 2 g via INTRAVENOUS

## 2014-11-18 MED ORDER — SODIUM CHLORIDE 0.9 % IJ SOLN: 3.0000 mL | INTRAMUSCULAR | Status: DC | PRN

## 2014-11-18 MED ORDER — MIDAZOLAM HCL 2 MG/2ML IJ SOLN
INTRAMUSCULAR | Status: AC
  Filled 2014-11-18: qty 2

## 2014-11-18 MED ORDER — CEFAZOLIN SODIUM-DEXTROSE 2-3 GM-% IV SOLR
INTRAVENOUS | Status: AC
  Filled 2014-11-18: qty 50

## 2014-11-18 MED ORDER — LACTATED RINGERS IV SOLN
INTRAVENOUS | Status: DC
  Administered 2014-11-18: 08:00:00 via INTRAVENOUS

## 2014-11-18 MED ORDER — LIDOCAINE HCL (CARDIAC) 10 MG/ML IV SOLN
INTRAVENOUS | Status: DC | PRN
  Administered 2014-11-18: 100 mg via INTRAVENOUS

## 2014-11-18 MED ORDER — ONDANSETRON HCL 4 MG/2ML IJ SOLN: 4.0000 mg | Freq: Once | INTRAMUSCULAR | Status: DC | PRN

## 2014-11-18 MED ORDER — OXYCODONE-ACETAMINOPHEN 5-325 MG PO TABS: 1.0000 | ORAL_TABLET | ORAL | Status: DC | PRN

## 2014-11-18 MED ORDER — ACETAMINOPHEN 650 MG RE SUPP: 650.0000 mg | RECTAL | Status: DC | PRN

## 2014-11-18 MED ORDER — DEXAMETHASONE SODIUM PHOSPHATE 4 MG/ML IJ SOLN
INTRAMUSCULAR | Status: DC | PRN
  Administered 2014-11-18: 10 mg via INTRAVENOUS

## 2014-11-18 MED ORDER — ACETAMINOPHEN 325 MG PO TABS: 650.0000 mg | ORAL_TABLET | ORAL | Status: DC | PRN

## 2014-11-18 SURGICAL SUPPLY — 47 items
BLADE HEX COATED 2.75 (ELECTRODE) ×3 IMPLANT
BLADE SURG 10 STRL SS (BLADE) ×3 IMPLANT
BLADE SURG 15 STRL LF DISP TIS (BLADE) ×1 IMPLANT
BLADE SURG 15 STRL SS (BLADE) ×2
CANISTER SUCT 1200ML W/VALVE (MISCELLANEOUS) IMPLANT
CHLORAPREP W/TINT 26ML (MISCELLANEOUS) ×3 IMPLANT
COVER BACK TABLE 60X90IN (DRAPES) ×3 IMPLANT
COVER MAYO STAND STRL (DRAPES) IMPLANT
DECANTER SPIKE VIAL GLASS SM (MISCELLANEOUS) IMPLANT
DRAPE LAPAROTOMY 100X72 PEDS (DRAPES) ×3 IMPLANT
DRAPE UTILITY XL STRL (DRAPES) ×3 IMPLANT
DRSG TEGADERM 4X4.75 (GAUZE/BANDAGES/DRESSINGS) ×3 IMPLANT
ELECT REM PT RETURN 9FT ADLT (ELECTROSURGICAL) ×3
ELECTRODE REM PT RTRN 9FT ADLT (ELECTROSURGICAL) ×1 IMPLANT
GLOVE BIO SURGEON STRL SZ 6 (GLOVE) ×3 IMPLANT
GLOVE BIO SURGEON STRL SZ 6.5 (GLOVE) ×2 IMPLANT
GLOVE BIO SURGEONS STRL SZ 6.5 (GLOVE) ×1
GLOVE BIOGEL PI IND STRL 6.5 (GLOVE) ×1 IMPLANT
GLOVE BIOGEL PI INDICATOR 6.5 (GLOVE) ×2
GOWN STRL REUS W/ TWL LRG LVL3 (GOWN DISPOSABLE) ×1 IMPLANT
GOWN STRL REUS W/TWL 2XL LVL3 (GOWN DISPOSABLE) ×3 IMPLANT
GOWN STRL REUS W/TWL LRG LVL3 (GOWN DISPOSABLE) ×2
LIQUID BAND (GAUZE/BANDAGES/DRESSINGS) ×3 IMPLANT
NEEDLE HYPO 25X1 1.5 SAFETY (NEEDLE) ×3 IMPLANT
NS IRRIG 1000ML POUR BTL (IV SOLUTION) ×3 IMPLANT
PACK BASIN DAY SURGERY FS (CUSTOM PROCEDURE TRAY) ×3 IMPLANT
PACK UNIVERSAL I (CUSTOM PROCEDURE TRAY) IMPLANT
PENCIL BUTTON HOLSTER BLD 10FT (ELECTRODE) ×3 IMPLANT
SLEEVE SCD COMPRESS KNEE MED (MISCELLANEOUS) ×3 IMPLANT
SPONGE GAUZE 4X4 12PLY STER LF (GAUZE/BANDAGES/DRESSINGS) ×3 IMPLANT
SPONGE LAP 18X18 X RAY DECT (DISPOSABLE) ×3 IMPLANT
STAPLER VISISTAT 35W (STAPLE) IMPLANT
SUT MNCRL AB 4-0 PS2 18 (SUTURE) ×3 IMPLANT
SUT SILK 3 0 TIES 17X18 (SUTURE)
SUT SILK 3-0 18XBRD TIE BLK (SUTURE) IMPLANT
SUT VIC AB 2-0 SH 27 (SUTURE) ×2
SUT VIC AB 2-0 SH 27XBRD (SUTURE) ×1 IMPLANT
SUT VIC AB 3-0 SH 27 (SUTURE) ×4
SUT VIC AB 3-0 SH 27X BRD (SUTURE) ×2 IMPLANT
SUT VICRYL 4-0 PS2 18IN ABS (SUTURE) IMPLANT
SYR CONTROL 10ML LL (SYRINGE) ×3 IMPLANT
TAPE STRIPS DRAPE STRL (GAUZE/BANDAGES/DRESSINGS) ×3 IMPLANT
TOWEL OR 17X24 6PK STRL BLUE (TOWEL DISPOSABLE) ×3 IMPLANT
TOWEL OR NON WOVEN STRL DISP B (DISPOSABLE) ×3 IMPLANT
TUBE CONNECTING 20'X1/4 (TUBING)
TUBE CONNECTING 20X1/4 (TUBING) IMPLANT
YANKAUER SUCT BULB TIP NO VENT (SUCTIONS) IMPLANT

## 2014-11-18 NOTE — Op Note (Signed)
PRE-OPERATIVE DIAGNOSIS: left back mass, subcutaneous, 9x7 cm  POST-OPERATIVE DIAGNOSIS:  Same  PROCEDURE:  Procedure(s): Excision of subcutaneous back mass, 7x9 cm  SURGEON:  Surgeon(s): Stark Klein, MD  ANESTHESIA:   local and general  DRAINS: none   LOCAL MEDICATIONS USED:  BUPIVICAINE  and LIDOCAINE   SPECIMEN:  Source of Specimen:  left back mass  DISPOSITION OF SPECIMEN:  PATHOLOGY  COUNTS:  YES  DICTATION: .Dragon Dictation  PLAN OF CARE: Discharge to home after PACU  PATIENT DISPOSITION:  PACU - hemodynamically stable.  FINDINGS:  Fatty back mass consistent with lipoma  EBL: min  PROCEDURE:    Patient was identified in the holding area and taken to the operating room where she was intubated on the stretcher. She was then flipped into the prone position on the operating room table. Her left back was prepped and draped in sterile fashion. Timeout was performed according to the surgical safety checklist. When all was correct, we continued.  A obliquely oriented transverse incision was made over the mass. The subcutaneous tissues were opened with the cautery. The mass was dissected away from the surrounding tissues. The cautery was used to divide the vascular attachments. The mass was then removed and passed off to pathology. The incision was then irrigated and hemostasis was achieved with the cautery.  The wound was closed in multiple layers with interrupted 2-0 Vicryl sutures on the deeper layers, 3-0 interrupted deep dermal sutures, and a running 4-0 Monocryl subcuticular suture. The incision was then cleaned, dried, and dressed with Dermabond. The patient was then flipped back onto the stretcher and extubated. The needle, sponge, and instrument counts were correct. The patient was taken to the recovery room in stable condition.

## 2014-11-18 NOTE — H&P (Signed)
Madison Lopez. Madison Lopez  Location: [facility] Patient #: [id number] DOB: Mar 26, 1956 Married / Language: English / Race: Black or African American Female  History of Present Illness  Patient words: lipoma on back.  The patient is a 59 year old female who presents with a complaint of Mass. Patient is a 59 year old female was referred by Dr. Baird Cancer for consultation regarding a mass on her left back. The mass has been there for approximately 10 years or more. She states that it did not hurt until recently. Also her husband noted that it was really starting to enlarge. She has pain when she leans back. It has not ever been infected. The mass is soft. The patient reports having her mammogram at [facility] in May of last year, and she underwent colonoscopy in October of last year.   Other Problems Anxiety Disorder Back Pain Breast Cancer Diverticulosis High blood pressure Hypercholesterolemia  Past Surgical History  Breast Biopsy Right. Breast Mass; Local Excision Right. Colon Polyp Removal - Colonoscopy  Diagnostic Studies History  Colonoscopy within last year Mammogram within last year Pap Smear 1-5 years ago  Allergies  Vicodin *ANALGESICS - OPIOID*  Medication History Metaxalone (800MG  Tablet, Oral) Active. Lisinopril-Hydrochlorothiazide (10-12.5MG  Tablet, Oral) Active.  Social History  No alcohol use No caffeine use No drug use Tobacco use Never smoker.  Family History Breast Cancer Sister. Cerebrovascular Accident Father. Colon Cancer Brother, Father. Hypertension Mother, Sister. Thyroid problems Sister.  Pregnancy / Birth History  Age at menarche 25 years. Age of menopause 79-50 Gravida 2 Irregular periods Maternal age 72-25 Para 1  Review of Systems General Not Present- Appetite Loss, Chills, Fatigue, Fever, Night Sweats, Weight Gain and Weight Loss. Skin Not Present- Change in Wart/Mole, Dryness, Hives, Jaundice, New  Lesions, Non-Healing Wounds, Rash and Ulcer. HEENT Present- Wears glasses/contact lenses. Not Present- Earache, Hearing Loss, Hoarseness, Nose Bleed, Oral Ulcers, Ringing in the Ears, Seasonal Allergies, Sinus Pain, Sore Throat, Visual Disturbances and Yellow Eyes. Respiratory Present- Snoring. Not Present- Bloody sputum, Chronic Cough, Difficulty Breathing and Wheezing. Breast Not Present- Breast Mass, Breast Pain, Nipple Discharge and Skin Changes. Cardiovascular Not Present- Chest Pain, Difficulty Breathing Lying Down, Leg Cramps, Palpitations, Rapid Heart Rate, Shortness of Breath and Swelling of Extremities. Gastrointestinal Present- Constipation. Not Present- Abdominal Pain, Bloating, Bloody Stool, Change in Bowel Habits, Chronic diarrhea, Difficulty Swallowing, Excessive gas, Gets full quickly at meals, Hemorrhoids, Indigestion, Nausea, Rectal Pain and Vomiting. Female Genitourinary Present- Frequency. Not Present- Nocturia, Painful Urination, Pelvic Pain and Urgency. Musculoskeletal Not Present- Back Pain, Joint Pain, Joint Stiffness, Muscle Pain, Muscle Weakness and Swelling of Extremities. Neurological Not Present- Decreased Memory, Fainting, Headaches, Numbness, Seizures, Tingling, Tremor, Trouble walking and Weakness. Psychiatric Not Present- Anxiety, Bipolar, Change in Sleep Pattern, Depression, Fearful and Frequent crying. Endocrine Present- Hot flashes. Not Present- Cold Intolerance, Excessive Hunger, Hair Changes, Heat Intolerance and New Diabetes. Hematology Not Present- Easy Bruising, Excessive bleeding, Gland problems, HIV and Persistent Infections.   Vitals  Wt Readings from Last 3 Encounters:  11/18/14 87.998 kg (194 lb)  03/07/14 92.987 kg (205 lb)  03/05/14 92.534 kg (204 lb)   Temp Readings from Last 3 Encounters:  11/18/14 97.6 F (36.4 C) Oral  03/07/14 98.7 F (37.1 C) Oral  03/05/14 98 F (36.7 C) Oral   BP Readings from Last 3 Encounters:  11/18/14 147/87   03/07/14 130/70  03/05/14 128/68   Pulse Readings from Last 3 Encounters:  11/18/14 90  03/07/14 99  03/05/14 97  Physical Exam General Mental Status-Alert. General Appearance-Consistent with stated age. Hydration-Well hydrated. Voice-Normal.  Integumentary Note: 8-10 cm mobile mass is present over the left lower thoracic/upper lumbar paraspinous muscles. The mass is soft. There are minimal skin changes in the center.   Head and Neck Head-normocephalic, atraumatic with no lesions or palpable masses.  Eye Sclera/Conjunctiva - Bilateral-No scleral icterus.  Chest and Lung Exam Chest and lung exam reveals -quiet, even and easy respiratory effort with no use of accessory muscles. Inspection Chest Wall - Normal. Back - normal.  Cardiovascular Cardiovascular examination reveals -normal pedal pulses bilaterally. Note: regular rate and rhythm  Abdomen Inspection-Inspection Normal. Palpation/Percussion Palpation and Percussion of the abdomen reveal - Soft, Non Tender, No Rebound tenderness, No Rigidity (guarding) and No hepatosplenomegaly.  Peripheral Vascular Upper Extremity Inspection - Bilateral - Normal - No Clubbing, No Cyanosis, No Edema, Pulses Intact. Lower Extremity Palpation - Edema - Bilateral - No edema.  Neurologic Neurologic evaluation reveals -alert and oriented x 3 with no impairment of recent or remote memory. Mental Status-Normal.  Musculoskeletal Global Assessment -Note: no gross deformities.  Normal Exam - Left-Upper Extremity Strength Normal and Lower Extremity Strength Normal. Normal Exam - Right-Upper Extremity Strength Normal and Lower Extremity Strength Normal.  Lymphatic Head & Neck  General Head & Neck Lymphatics: Bilateral - Description - Normal. Axillary  General Axillary Region: Bilateral - Description - Normal. Tenderness - Non Tender.    Assessment & Plan  MASS OF SUBCUTANEOUS TISSUE OF  BACK (782.2  R22.2) Impression: We will schedule the patient for excision of this soft tissue mass. This is most likely a lipoma. Since it has become symptomatic and is enlarging, I do think it warrants removal. I discussed that I would make an obliquely oriented incisions to minimize the numbness of the incision. I also discussed that this will minimize the tension on the incision. I also discussed with the patient that she may end up needing a drain. I reviewed the risk of bleeding and infection. I also discussed that some patients findings quite painful, especially if they are underneath one of the fascial layers. I discussed this remaining additional procedures such as drainage of a seroma. She understands and wishes to proceed. Current Plans  Pt Education - CSS Breast Biopsy Instructions (FLB): discussed with patient and provided information.   Signed by Stark Klein, MD

## 2014-11-18 NOTE — Discharge Instructions (Addendum)
[place] Office Phone Number [phone]   POST OP INSTRUCTIONS  Always review your discharge instruction sheet given to you by the facility where your surgery was performed.  IF YOU HAVE DISABILITY OR FAMILY LEAVE FORMS, YOU MUST BRING THEM TO THE OFFICE FOR PROCESSING.  DO NOT GIVE THEM TO YOUR DOCTOR.  1. A prescription for pain medication may be given to you upon discharge.  Take your pain medication as prescribed, if needed.  If narcotic pain medicine is not needed, then you may take acetaminophen (Tylenol) or ibuprofen (Advil) as needed. 2. Take your usually prescribed medications unless otherwise directed 3. If you need a refill on your pain medication, please contact your pharmacy.  They will contact our office to request authorization.  Prescriptions will not be filled after 5pm or on week-ends. 4. You should eat very light the first 24 hours after surgery, such as soup, crackers, pudding, etc.  Resume your normal diet the day after surgery 5. It is common to experience some constipation if taking pain medication after surgery.  Increasing fluid intake and taking a stool softener will usually help or prevent this problem from occurring.  A mild laxative (Milk of Magnesia or Miralax) should be taken according to package directions if there are no bowel movements after 48 hours. 6. You may shower in 48 hours.  The surgical glue will flake off in 2-3 weeks.   7. ACTIVITIES:  No strenuous activity or heavy lifting for 1 week.   a. You may drive when you no longer are taking prescription pain medication, you can comfortably wear a seatbelt, and you can safely maneuver your car and apply brakes. b. RETURN TO WORK:  __________as tolerated as long as restrictions are followed.  may need several weeks off.  _______________ Dennis Bast should see your doctor in the office for a follow-up appointment approximately three-four weeks after your surgery.    WHEN TO CALL YOUR  DOCTOR: 1. Fever over 101.0 2. Nausea and/or vomiting. 3. Extreme swelling or bruising. 4. Continued bleeding from incision. 5. Increased pain, redness, or drainage from the incision.  The clinic staff is available to answer your questions during regular business hours.  Please dont hesitate to call and ask to speak to one of the nurses for clinical concerns.  If you have a medical emergency, go to the nearest emergency room or call 911.  A surgeon from [facility] is always on call at the hospital.  For further questions, please visit [email or web address]   Post Anesthesia Home Care Instructions  Activity: Get plenty of rest for the remainder of the day. A responsible adult should stay with you for 24 hours following the procedure.  For the next 24 hours, DO NOT: -Drive a car -Paediatric nurse -Drink alcoholic beverages -Take any medication unless instructed by your physician -Make any legal decisions or sign important papers.  Meals: Start with liquid foods such as gelatin or soup. Progress to regular foods as tolerated. Avoid greasy, spicy, heavy foods. If nausea and/or vomiting occur, drink only clear liquids until the nausea and/or vomiting subsides. Call your physician if vomiting continues.  Special Instructions/Symptoms: Your throat may feel dry or sore from the anesthesia or the breathing tube placed in your throat during surgery. If this causes discomfort, gargle with warm salt water. The discomfort should disappear within 24 hours.

## 2014-11-18 NOTE — Anesthesia Postprocedure Evaluation (Signed)
  Anesthesia Post-op Note  Patient: Madison Lopez  Procedure(s) Performed: Procedure(s): EXCISION MASS LEFT BACK (Left)  Patient Location: PACU  Anesthesia Type: General   Level of Consciousness: awake, alert  and oriented  Airway and Oxygen Therapy: Patient Spontanous Breathing  Post-op Pain: none  Post-op Assessment: Post-op Vital signs reviewed  Post-op Vital Signs: Reviewed  Last Vitals:  Filed Vitals:   11/18/14 1021  BP: 135/71  Pulse: 91  Temp: 36.7 C  Resp: 16    Complications: No apparent anesthesia complications

## 2014-11-18 NOTE — Anesthesia Preprocedure Evaluation (Signed)
Anesthesia Evaluation  Patient identified by MRN, date of birth, ID band Patient awake    Reviewed: Allergy & Precautions, NPO status , Patient's Chart, lab work & pertinent test results  Airway Mallampati: I  TM Distance: >3 FB Neck ROM: Full    Dental  (+) Dental Advisory Given, Teeth Intact   Pulmonary  breath sounds clear to auscultation        Cardiovascular hypertension, Pt. on medications Rhythm:Regular Rate:Normal     Neuro/Psych    GI/Hepatic GERD-  Medicated and Controlled,  Endo/Other  Morbid obesity  Renal/GU      Musculoskeletal   Abdominal   Peds  Hematology   Anesthesia Other Findings   Reproductive/Obstetrics                             Anesthesia Physical Anesthesia Plan  ASA: II  Anesthesia Plan: General   Post-op Pain Management:    Induction: Intravenous  Airway Management Planned: LMA and Oral ETT  Additional Equipment:   Intra-op Plan:   Post-operative Plan: Extubation in OR  Informed Consent: I have reviewed the patients History and Physical, chart, labs and discussed the procedure including the risks, benefits and alternatives for the proposed anesthesia with the patient or authorized representative who has indicated his/her understanding and acceptance.   Dental advisory given  Plan Discussed with: CRNA, Anesthesiologist and Surgeon  Anesthesia Plan Comments:         Anesthesia Quick Evaluation

## 2014-11-18 NOTE — Anesthesia Procedure Notes (Signed)
Procedure Name: Intubation Date/Time: 11/18/2014 8:42 AM Performed by: Lyndee Leo Pre-anesthesia Checklist: Patient identified, Emergency Drugs available, Suction available and Patient being monitored Patient Re-evaluated:Patient Re-evaluated prior to inductionOxygen Delivery Method: Circle System Utilized Preoxygenation: Pre-oxygenation with 100% oxygen Intubation Type: IV induction Ventilation: Mask ventilation without difficulty Laryngoscope Size: Mac and 3 Grade View: Grade III Tube type: Oral Tube size: 7.0 mm Number of attempts: 1 Airway Equipment and Method: Stylet and Oral airway Placement Confirmation: ETT inserted through vocal cords under direct vision,  positive ETCO2 and breath sounds checked- equal and bilateral Secured at: 21 cm Tube secured with: Tape Dental Injury: Teeth and Oropharynx as per pre-operative assessment

## 2014-11-18 NOTE — Transfer of Care (Signed)
Immediate Anesthesia Transfer of Care Note  Patient: Madison Lopez  Procedure(s) Performed: Procedure(s): EXCISION MASS LEFT BACK (Left)  Patient Location: PACU  Anesthesia Type:General  Level of Consciousness: awake, sedated and patient cooperative  Airway & Oxygen Therapy: Patient Spontanous Breathing and Patient connected to face mask oxygen  Post-op Assessment: Report given to RN and Post -op Vital signs reviewed and stable  Post vital signs: Reviewed and stable  Last Vitals:  Filed Vitals:   11/18/14 0749  BP: 147/87  Pulse: 90  Temp: 36.4 C  Resp: 16    Complications: No apparent anesthesia complications

## 2014-11-19 ENCOUNTER — Encounter (HOSPITAL_BASED_OUTPATIENT_CLINIC_OR_DEPARTMENT_OTHER): Payer: Self-pay | Admitting: General Surgery

## 2015-06-25 IMAGING — CR DG CHEST 2V
2 series · 2 of 2 positions shown · non-contrast
Comparison: Prior chest x-ray 07/12/2010

CLINICAL DATA: Cough, congestion

EXAM:
CHEST  2 VIEW

[PA]
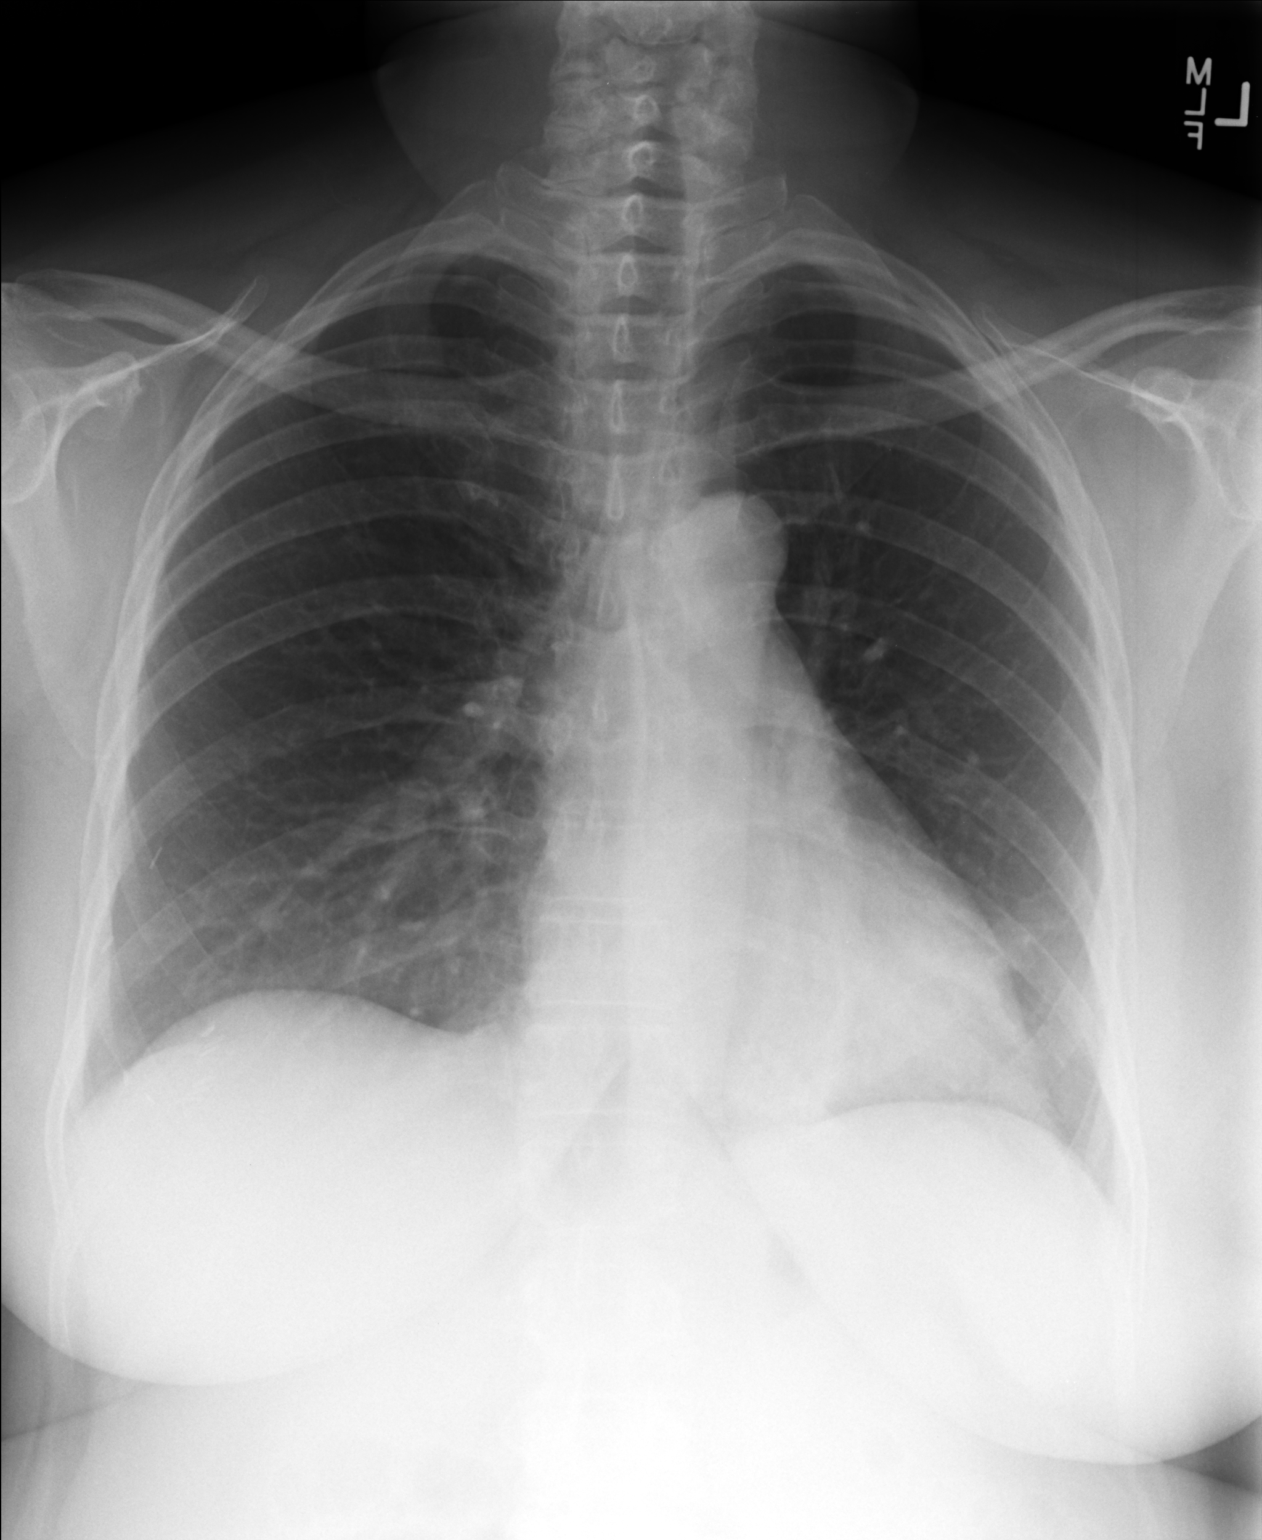

[lateral]
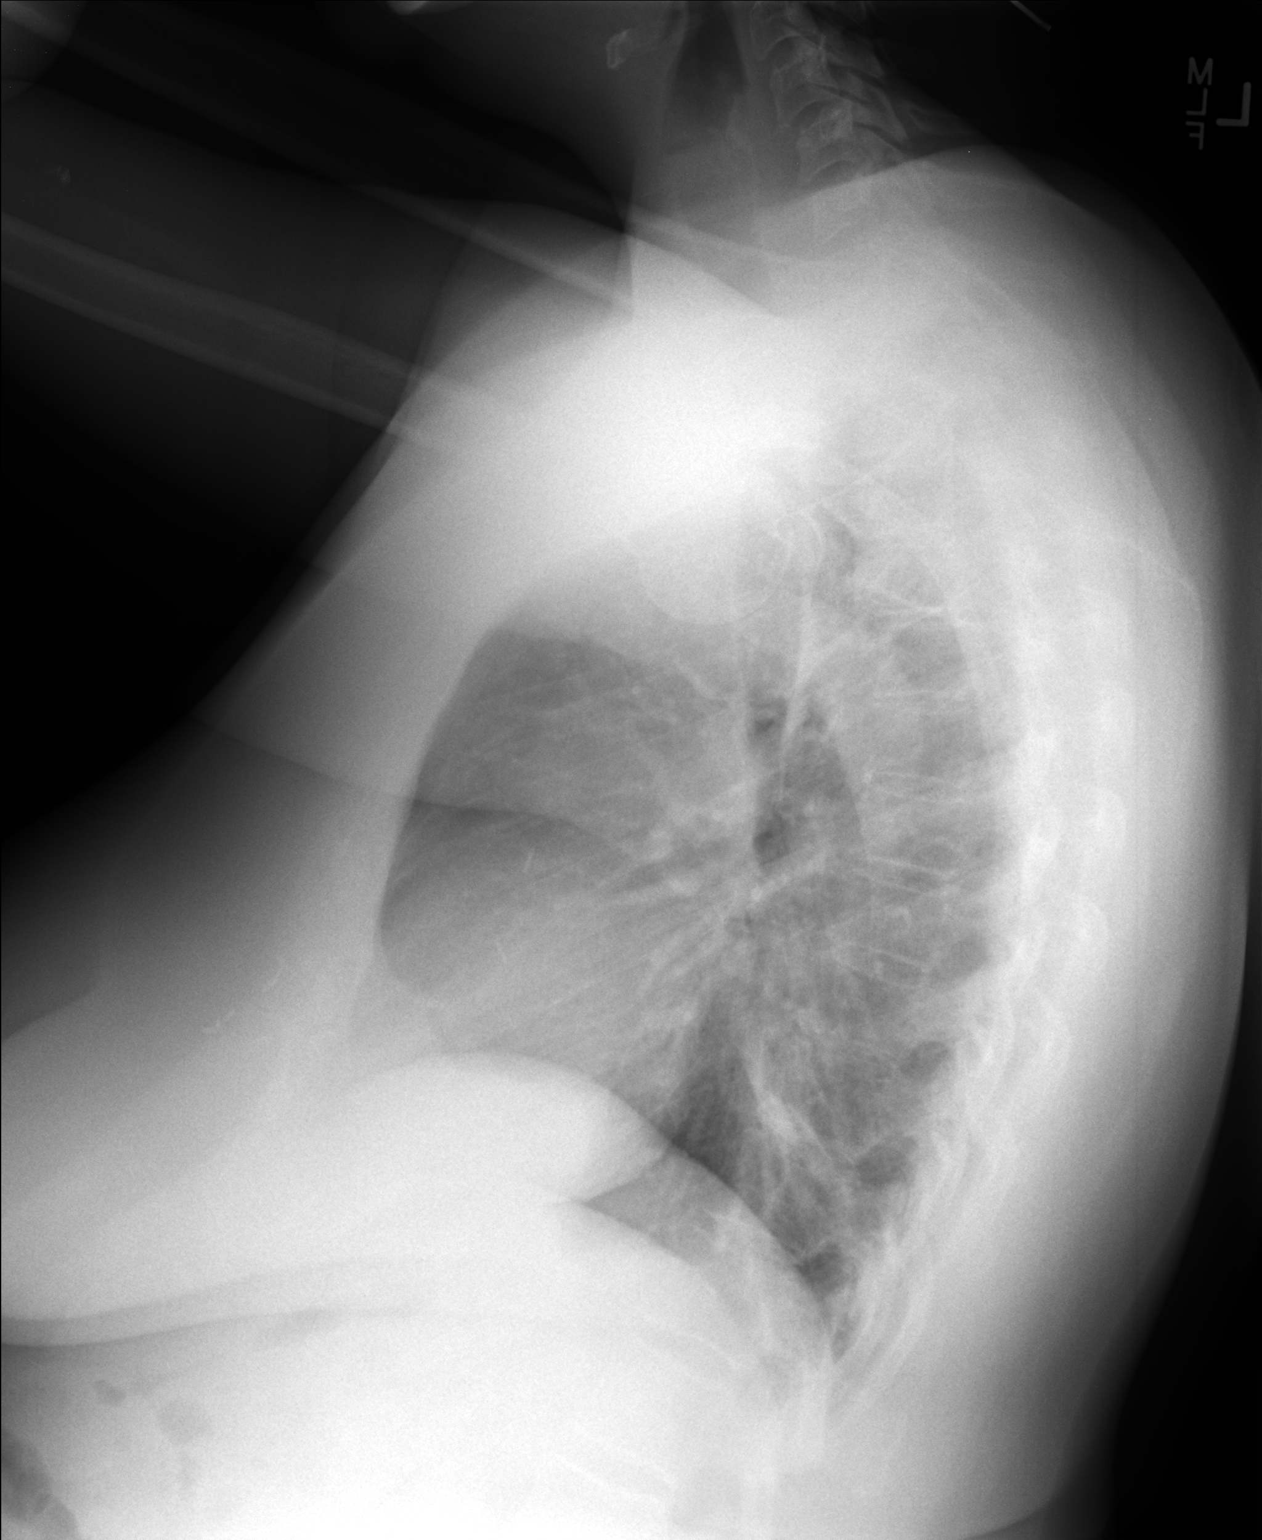

[2 of 2 positions shown; findings below may reference images not displayed]

FINDINGS: Increasing peribronchovascular opacities in the medial left lower
lobe. Cardiac and mediastinal contours remain within normal limits.
No pleural effusion or pneumothorax. No pulmonary edema. No acute
osseous abnormality.
IMPRESSION: Developing medial left lower lobe peribronchovascular opacities may
reflect early bronchopneumonia, or left lower lobe bronchitis with
subsegmental atelectasis.

These results were called by telephone at the time of interpretation
on 06/16/2013 at [DATE] to Dr. MARTHIE MIYAMBO , who verbally
acknowledged these results.

## 2015-07-30 ENCOUNTER — Ambulatory Visit (INDEPENDENT_AMBULATORY_CARE_PROVIDER_SITE_OTHER): Payer: 59 | Admitting: Physician Assistant

## 2015-07-30 VITALS — BP 132/78 | HR 110 | Temp 98.5°F | Resp 16 | Ht 64.0 in | Wt 195.6 lb

## 2015-07-30 DIAGNOSIS — M545 Low back pain, unspecified: Secondary | ICD-10-CM

## 2015-07-30 MED ORDER — DICLOFENAC SODIUM 75 MG PO TBEC: 75.0000 mg | DELAYED_RELEASE_TABLET | Freq: Two times a day (BID) | ORAL | Status: AC

## 2015-07-30 MED ORDER — METAXALONE 800 MG PO TABS: 400.0000 mg | ORAL_TABLET | Freq: Three times a day (TID) | ORAL | Status: AC

## 2015-07-30 NOTE — Progress Notes (Signed)
   Madison Lopez  MRN: [id number] DOB: 1956-02-08  Subjective:  Pt presents to clinic with back pain that started today more on the right side.  She has been really careful today with her movements because that causes increase pain.  No change in pain with urination and no urinary symptoms.  She cannot think of an injury - she did increase her workouts this week and she thinks this might have done it.  Home treatment - heat pad and cream to help with the pad -   Patient Active Problem List   Diagnosis Date Noted  . 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 Family history of colon cancer 10/14/2011    Current Outpatient Prescriptions on File Prior to Visit  Medication Sig Dispense Refill  . co-enzyme Q-10 50 MG capsule Take 50 mg by mouth 2 (two) times daily.      Marland Kitchen GARLIC PO Take by mouth.      Marland Kitchen HAWTHORN PO Take by mouth.      Marland Kitchen lisinopril (PRINIVIL,ZESTRIL) 5 MG tablet Take 2.5 mg by mouth daily.      No current facility-administered medications on file prior to visit.    Allergies  Allergen Reactions  . Vicodin [Hydrocodone-Acetaminophen] Swelling    Review of Systems  Genitourinary: Negative.  Negative for dysuria, urgency, vaginal discharge and genital sores.  Musculoskeletal: Positive for back pain.   Objective:  BP 132/78 mmHg  Pulse 110  Temp(Src) 98.5 F (36.9 C) (Oral)  Resp 16  Ht 5\' 4"  (1.626 m)  Wt 195 lb 9.6 oz (88.724 kg)  BMI 33.56 kg/m2  SpO2 97%  Physical Exam  Constitutional: She is oriented to person, place, and time and well-developed, well-nourished, and in no distress.  HENT:  Head: Normocephalic and atraumatic.  Right Ear: Hearing and external ear normal.  Left Ear: Hearing and external ear normal.  Eyes: Conjunctivae are normal.  Neck: Normal range of motion.  Pulmonary/Chest: Effort normal.  Abdominal: There is no CVA tenderness.  Musculoskeletal:       Lumbar back: She exhibits decreased range of motion, tenderness  and spasm.  Neurological: She is alert and oriented to person, place, and time. She has normal sensation, normal strength and normal reflexes. She displays no weakness. She has a normal Straight Leg Raise Test. Gait normal. Gait normal.  Skin: Skin is warm and dry.  Psychiatric: Mood, memory, affect and judgment normal.  Vitals reviewed.   Assessment and Plan :  Right-sided low back pain without sciatica - Plan: diclofenac (VOLTAREN) 75 MG EC tablet, metaxalone (SKELAXIN) 800 MG tablet   Heat and stretches - start above medications - use prn - recheck in a week if no better - sooner if her symptoms are worse or gotten worse  Windell Hummingbird PA-C  [facility] 07/30/2015 8:55 PM

## 2015-07-30 NOTE — Patient Instructions (Signed)
Heat and stretches  Back Exercises The following exercises strengthen the muscles that help to support the back. They also help to keep the lower back flexible. Doing these exercises can help to prevent back pain or lessen existing pain. If you have back pain or discomfort, try doing these exercises 2-3 times each day or as told by your health care provider. When the pain goes away, do them once each day, but increase the number of times that you repeat the steps for each exercise (do more repetitions). If you do not have back pain or discomfort, do these exercises once each day or as told by your health care provider. EXERCISES Single Knee to Chest Repeat these steps 3-5 times for each leg: 1. Lie on your back on a firm bed or the floor with your legs extended. 2. Bring one knee to your chest. Your other leg should stay extended and in contact with the floor. 3. Hold your knee in place by grabbing your knee or thigh. 4. Pull on your knee until you feel a gentle stretch in your lower back. 5. Hold the stretch for 10-30 seconds. 6. Slowly release and straighten your leg. Pelvic Tilt Repeat these steps 5-10 times: 1. Lie on your back on a firm bed or the floor with your legs extended. 2. Bend your knees so they are pointing toward the ceiling and your feet are flat on the floor. 3. Tighten your lower abdominal muscles to press your lower back against the floor. This motion will tilt your pelvis so your tailbone points up toward the ceiling instead of pointing to your feet or the floor. 4. With gentle tension and even breathing, hold this position for 5-10 seconds. Cat-Cow Repeat these steps until your lower back becomes more flexible: 1. Get into a hands-and-knees position on a firm surface. Keep your hands under your shoulders, and keep your knees under your hips. You may place padding under your knees for comfort. 2. Let your head hang down, and point your tailbone toward the floor so your  lower back becomes rounded like the back of a cat. 3. Hold this position for 5 seconds. 4. Slowly lift your head and point your tailbone up toward the ceiling so your back forms a sagging arch like the back of a cow. 5. Hold this position for 5 seconds. Press-Ups Repeat these steps 5-10 times: 1. Lie on your abdomen (face-down) on the floor. 2. Place your palms near your head, about shoulder-width apart. 3. While you keep your back as relaxed as possible and keep your hips on the floor, slowly straighten your arms to raise the top half of your body and lift your shoulders. Do not use your back muscles to raise your upper torso. You may adjust the placement of your hands to make yourself more comfortable. 4. Hold this position for 5 seconds while you keep your back relaxed. 5. Slowly return to lying flat on the floor. Bridges Repeat these steps 10 times: 1. Lie on your back on a firm surface. 2. Bend your knees so they are pointing toward the ceiling and your feet are flat on the floor. 3. Tighten your buttocks muscles and lift your buttocks off of the floor until your waist is at almost the same height as your knees. You should feel the muscles working in your buttocks and the back of your thighs. If you do not feel these muscles, slide your feet 1-2 inches farther away from your buttocks. 4. Hold  this position for 3-5 seconds. 5. Slowly lower your hips to the starting position, and allow your buttocks muscles to relax completely. If this exercise is too easy, try doing it with your arms crossed over your chest. Abdominal Crunches Repeat these steps 5-10 times: 1. Lie on your back on a firm bed or the floor with your legs extended. 2. Bend your knees so they are pointing toward the ceiling and your feet are flat on the floor. 3. Cross your arms over your chest. 4. Tip your chin slightly toward your chest without bending your neck. 5. Tighten your abdominal muscles and slowly raise your trunk  (torso) high enough to lift your shoulder blades a tiny bit off of the floor. Avoid raising your torso higher than that, because it can put too much stress on your low back and it does not help to strengthen your abdominal muscles. 6. Slowly return to your starting position. Back Lifts Repeat these steps 5-10 times: 1. Lie on your abdomen (face-down) with your arms at your sides, and rest your forehead on the floor. 2. Tighten the muscles in your legs and your buttocks. 3. Slowly lift your chest off of the floor while you keep your hips pressed to the floor. Keep the back of your head in line with the curve in your back. Your eyes should be looking at the floor. 4. Hold this position for 3-5 seconds. 5. Slowly return to your starting position. SEEK MEDICAL CARE IF:  Your back pain or discomfort gets much worse when you do an exercise.  Your back pain or discomfort does not lessen within 2 hours after you exercise. If you have any of these problems, stop doing these exercises right away. Do not do them again unless your health care provider says that you can. SEEK IMMEDIATE MEDICAL CARE IF:  You develop sudden, severe back pain. If this happens, stop doing the exercises right away. Do not do them again unless your health care provider says that you can.   This information is not intended to replace advice given to you by your health care provider. Make sure you discuss any questions you have with your health care provider.   Document Released: 09/22/2004 Document Revised: 05/06/2015 Document Reviewed: 10/09/2014 Elsevier Interactive Patient Education [employer].

## 2015-10-13 ENCOUNTER — Ambulatory Visit (INDEPENDENT_AMBULATORY_CARE_PROVIDER_SITE_OTHER): Payer: 59 | Admitting: Family Medicine

## 2015-10-13 VITALS — BP 144/62 | HR 135 | Temp 102.2°F | Resp 18 | Ht 65.0 in | Wt 192.0 lb

## 2015-10-13 DIAGNOSIS — R509 Fever, unspecified: Secondary | ICD-10-CM

## 2015-10-13 DIAGNOSIS — J111 Influenza due to unidentified influenza virus with other respiratory manifestations: Secondary | ICD-10-CM | POA: Diagnosis not present

## 2015-10-13 LAB — POCT INFLUENZA A/B
Influenza A, POC: POSITIVE — AB
Influenza B, POC: NEGATIVE

## 2015-10-13 MED ORDER — BENZONATATE 100 MG PO CAPS: 200.0000 mg | ORAL_CAPSULE | Freq: Two times a day (BID) | ORAL | Status: AC | PRN

## 2015-10-13 MED ORDER — IBUPROFEN 200 MG PO TABS
800.0000 mg | ORAL_TABLET | Freq: Once | ORAL | Status: AC
  Administered 2015-10-13: 800 mg via ORAL

## 2015-10-13 MED ORDER — OSELTAMIVIR PHOSPHATE 75 MG PO CAPS: 75.0000 mg | ORAL_CAPSULE | Freq: Two times a day (BID) | ORAL | Status: AC

## 2015-10-13 NOTE — Progress Notes (Signed)
Chief Complaint:  Chief Complaint  Patient presents with  . Fatigue    since last night  . Generalized Body Aches  . Depression    HPI: Madison Lopez is a 60 y.o. female who reports to [facility] today complaining of flu like sxs, fatigue, body aches, fevers since last night.  + sick exposure. Tried otc meds without relief.   Past Medical History  Diagnosis Date  . Hypertension   . Hyperlipemia   . Wears glasses   . Cancer ([place])     right breast  . GERD (gastroesophageal reflux disease)   . Arthritis    Past Surgical History  Procedure Laterality Date  . Hand / finger lesion excision  1987  . Colonoscopy    . Breast lumpectomy  03/14/10    right breast-rt lump-  . Mass excision Left 11/18/2014    Procedure: EXCISION MASS LEFT BACK;  Surgeon: Stark Klein, MD;  Location: [facility];  Service: General;  Laterality: Left;   Social History   Social History  . Marital Status: Married    Spouse Name: N/A  . Number of Children: N/A  . Years of Education: N/A   Social History Main Topics  . Smoking status: Never Smoker   . Smokeless tobacco: None  . Alcohol Use: No  . Drug Use: No  . Sexual Activity: Not Asked   Other Topics Concern  . None   Social History Narrative   Family History  Problem Relation Age of Onset  . Rectal cancer Brother   . Cancer Brother     colon  . Cancer Father     colon  . Stroke Father   . Cancer Sister     breast  . Cancer Sister     colon   Allergies  Allergen Reactions  . Vicodin [Hydrocodone-Acetaminophen] Swelling   Prior to Admission medications   Medication Sig Start Date End Date Taking? Authorizing Provider  co-enzyme Q-10 50 MG capsule Take 50 mg by mouth 2 (two) times daily.     Yes Historical Provider, MD  diclofenac (VOLTAREN) 75 MG EC tablet Take 1 tablet (75 mg total) by mouth 2 (two) times daily. 07/30/15  Yes Mancel Bale, PA-C  GARLIC PO Take by mouth.     Yes Historical Provider, MD    lisinopril (PRINIVIL,ZESTRIL) 5 MG tablet Take 2.5 mg by mouth daily.    Yes Historical Provider, MD  metaxalone (SKELAXIN) 800 MG tablet Take 0.5-1 tablets (400-800 mg total) by mouth 3 (three) times daily. 07/30/15  Yes Mancel Bale, PA-C  HAWTHORN PO Take by mouth. Reported on 10/13/2015    Historical Provider, MD     ROS: The patient denies  night sweats, unintentional weight loss, chest pain, palpitations, wheezing, dyspnea on exertion, nausea, vomiting, abdominal pain, dysuria, hematuria, melena, numbness, weakness, or tingling.  All other systems have been reviewed and were otherwise negative with the exception of those mentioned in the HPI and as above.    PHYSICAL EXAM: Filed Vitals:   10/13/15 1558  BP: 144/62  Pulse: 135  Temp: 102.2 F (39 C)  Resp: 18   Body mass index is 31.95 kg/(m^2).   General: Alert, tired appearing HEENT:  Normocephalic, atraumatic, oropharynx patent. EOMI, PERRLA, tm nl, no exudates Cardiovascular:  Regular rate and rhythm, no rubs murmurs or gallops.   Respiratory: Clear to auscultation bilaterally.  No wheezes, rales, or rhonchi.  No cyanosis, no use of  accessory musculature Abdominal: No organomegaly, abdomen is soft and non-tender, positive bowel sounds. No masses. Skin: No rashes. Neurologic: Facial musculature symmetric. Psychiatric: Patient acts appropriately throughout our interaction. Lymphatic: No cervical or submandibular lymphadenopathy Musculoskeletal: Gait intact. No edema, tenderness   LABS: Results for orders placed or performed in visit on 10/13/15  POCT Influenza A/B  Result Value Ref Range   Influenza A, POC Positive (A) Negative   Influenza B, POC Negative Negative     EKG/XRAY:   Primary read interpreted by Dr. Marin Comment at [facility].   ASSESSMENT/PLAN: Encounter Diagnoses  Name Primary?  . Fever, unspecified   . Influenza with respiratory manifestation Yes   Rx tamiflu Fu prn   Gross sideeffects, risk and benefits,  and alternatives of medications d/w patient. Patient is aware that all medications have potential sideeffects and we are unable to predict every sideeffect or drug-drug interaction that may occur.  [name] DO  10/23/2015 9:47 AM

## 2015-10-13 NOTE — Patient Instructions (Signed)
Influenza, Adult Influenza ("the flu") is a viral infection of the respiratory tract. It occurs more often in winter months because people spend more time in close contact with one another. Influenza can make you feel very sick. Influenza easily spreads from person to person (contagious). CAUSES  Influenza is caused by a virus that infects the respiratory tract. You can catch the virus by breathing in droplets from an infected person's cough or sneeze. You can also catch the virus by touching something that was recently contaminated with the virus and then touching your mouth, nose, or eyes. RISKS AND COMPLICATIONS You may be at risk for a more severe case of influenza if you smoke cigarettes, have diabetes, have chronic heart disease (such as heart failure) or lung disease (such as asthma), or if you have a weakened immune system. Elderly people and pregnant women are also at risk for more serious infections. The most common problem of influenza is a lung infection (pneumonia). Sometimes, this problem can require emergency medical care and may be life threatening. SIGNS AND SYMPTOMS  Symptoms typically last 4 to 10 days and may include:  Fever.  Chills.  Headache, body aches, and muscle aches.  Sore throat.  Chest discomfort and cough.  Poor appetite.  Weakness or feeling tired.  Dizziness.  Nausea or vomiting. DIAGNOSIS  Diagnosis of influenza is often made based on your history and a physical exam. A nose or throat swab test can be done to confirm the diagnosis. TREATMENT  In mild cases, influenza goes away on its own. Treatment is directed at relieving symptoms. For more severe cases, your health care provider may prescribe antiviral medicines to shorten the sickness. Antibiotic medicines are not effective because the infection is caused by a virus, not by bacteria. HOME CARE INSTRUCTIONS  Take medicines only as directed by your health care provider.  Use a cool mist humidifier  to make breathing easier.  Get plenty of rest until your temperature returns to normal. This usually takes 3 to 4 days.  Drink enough fluid to keep your urine clear or pale yellow.  Cover yourmouth and nosewhen coughing or sneezing,and wash your handswellto prevent thevirusfrom spreading.  Stay homefromwork orschool untilthe fever is gonefor at least 1full day. PREVENTION  An annual influenza vaccination (flu shot) is the best way to avoid getting influenza. An annual flu shot is now routinely recommended for all adults in the [place] SEEK MEDICAL CARE IF:  You experiencechest pain, yourcough worsens,or you producemore mucus.  Youhave nausea,vomiting, ordiarrhea.  Your fever returns or gets worse. SEEK IMMEDIATE MEDICAL CARE IF:  You havetrouble breathing, you become short of breath,or your skin ornails becomebluish.  You have severe painor stiffnessin the neck.  You develop a sudden headache, or pain in the face or ear.  You have nausea or vomiting that you cannot control. MAKE SURE YOU:   Understand these instructions.  Will watch your condition.  Will get help right away if you are not doing well or get worse.   This information is not intended to replace advice given to you by your health care provider. Make sure you discuss any questions you have with your health care provider.   Document Released: 08/12/2000 Document Revised: 09/05/2014 Document Reviewed: 11/14/2011 Elsevier Interactive Patient Education 2016 [employer]   

## 2016-12-27 ENCOUNTER — Other Ambulatory Visit (HOSPITAL_COMMUNITY)
Admission: RE | Admit: 2016-12-27 | Discharge: 2016-12-27 | Disposition: A | Discharge status: Home / Self Care | Payer: 59 | Source: Ambulatory Visit | Attending: Family Medicine | Admitting: Family Medicine

## 2016-12-27 ENCOUNTER — Other Ambulatory Visit: Payer: Self-pay | Admitting: Family Medicine

## 2016-12-27 DIAGNOSIS — Z01419 Encounter for gynecological examination (general) (routine) without abnormal findings: Secondary | ICD-10-CM | POA: Diagnosis present

## 2016-12-27 DIAGNOSIS — Z1151 Encounter for screening for human papillomavirus (HPV): Secondary | ICD-10-CM | POA: Insufficient documentation

## 2016-12-28 LAB — CYTOLOGY - PAP
ADEQUACY: ABSENT
DIAGNOSIS: NEGATIVE
HPV (WINDOPATH): NOT DETECTED

## 2022-02-03 ENCOUNTER — Other Ambulatory Visit: Payer: Self-pay | Admitting: Obstetrics and Gynecology

## 2022-02-03 ENCOUNTER — Other Ambulatory Visit (HOSPITAL_COMMUNITY)
Admission: RE | Admit: 2022-02-03 | Discharge: 2022-02-03 | Disposition: A | Discharge status: Home / Self Care | Payer: 59 | Source: Ambulatory Visit | Attending: Obstetrics and Gynecology | Admitting: Obstetrics and Gynecology

## 2022-02-03 DIAGNOSIS — Z01419 Encounter for gynecological examination (general) (routine) without abnormal findings: Secondary | ICD-10-CM | POA: Insufficient documentation

## 2022-02-04 LAB — CYTOLOGY - PAP
Comment: NEGATIVE
Diagnosis: NEGATIVE
High risk HPV: NEGATIVE

## 2022-09-22 ENCOUNTER — Other Ambulatory Visit: Payer: Self-pay | Admitting: Internal Medicine

## 2022-09-22 DIAGNOSIS — Z1231 Encounter for screening mammogram for malignant neoplasm of breast: Secondary | ICD-10-CM

## 2022-11-11 ENCOUNTER — Ambulatory Visit: Payer: 59
